# Patient Record
Sex: Female | Born: 1960 | Race: White | Hispanic: No | Smoking: Never smoker
Health system: Southern US, Community
[De-identification: ages and names within clinical notes are randomized; demographics above are authoritative.]

## PROBLEM LIST (undated history)

## (undated) DIAGNOSIS — I10 Essential (primary) hypertension: Secondary | ICD-10-CM

## (undated) DIAGNOSIS — E119 Type 2 diabetes mellitus without complications: Secondary | ICD-10-CM

## (undated) HISTORY — PX: APPENDECTOMY: SHX54

---

## 2008-11-15 ENCOUNTER — Ambulatory Visit: Payer: Self-pay | Admitting: Diagnostic Radiology

## 2008-11-15 ENCOUNTER — Emergency Department (HOSPITAL_BASED_OUTPATIENT_CLINIC_OR_DEPARTMENT_OTHER): Admission: EM | Admit: 2008-11-15 | Discharge: 2008-11-15 | Payer: Self-pay | Admitting: Emergency Medicine

## 2010-05-18 LAB — URINALYSIS, ROUTINE W REFLEX MICROSCOPIC
Bilirubin Urine: NEGATIVE
Hgb urine dipstick: NEGATIVE
Protein, ur: NEGATIVE mg/dL
pH: 5.5 (ref 5.0–8.0)

## 2010-05-18 LAB — URINE MICROSCOPIC-ADD ON

## 2010-05-18 LAB — PREGNANCY, URINE: Preg Test, Ur: NEGATIVE

## 2016-06-18 ENCOUNTER — Emergency Department (HOSPITAL_BASED_OUTPATIENT_CLINIC_OR_DEPARTMENT_OTHER): Payer: Managed Care, Other (non HMO)

## 2016-06-18 ENCOUNTER — Encounter (HOSPITAL_BASED_OUTPATIENT_CLINIC_OR_DEPARTMENT_OTHER): Payer: Self-pay | Admitting: *Deleted

## 2016-06-18 ENCOUNTER — Emergency Department (HOSPITAL_BASED_OUTPATIENT_CLINIC_OR_DEPARTMENT_OTHER)
Admission: EM | Admit: 2016-06-18 | Discharge: 2016-06-18 | Discharge status: Against Medical Advice | Payer: Managed Care, Other (non HMO) | Attending: Emergency Medicine | Admitting: Emergency Medicine

## 2016-06-18 DIAGNOSIS — I10 Essential (primary) hypertension: Secondary | ICD-10-CM | POA: Diagnosis not present

## 2016-06-18 DIAGNOSIS — Z5181 Encounter for therapeutic drug level monitoring: Secondary | ICD-10-CM | POA: Diagnosis not present

## 2016-06-18 DIAGNOSIS — R4781 Slurred speech: Secondary | ICD-10-CM | POA: Diagnosis present

## 2016-06-18 DIAGNOSIS — E119 Type 2 diabetes mellitus without complications: Secondary | ICD-10-CM | POA: Insufficient documentation

## 2016-06-18 DIAGNOSIS — R410 Disorientation, unspecified: Secondary | ICD-10-CM | POA: Diagnosis not present

## 2016-06-18 HISTORY — DX: Essential (primary) hypertension: I10

## 2016-06-18 HISTORY — DX: Type 2 diabetes mellitus without complications: E11.9

## 2016-06-18 LAB — CBC
HEMATOCRIT: 28.5 % — AB (ref 36.0–46.0)
HEMOGLOBIN: 8.5 g/dL — AB (ref 12.0–15.0)
MCH: 19.5 pg — ABNORMAL LOW (ref 26.0–34.0)
MCHC: 29.8 g/dL — ABNORMAL LOW (ref 30.0–36.0)
MCV: 65.4 fL — AB (ref 78.0–100.0)
Platelets: 451 10*3/uL — ABNORMAL HIGH (ref 150–400)
RBC: 4.36 MIL/uL (ref 3.87–5.11)
RDW: 18.9 % — ABNORMAL HIGH (ref 11.5–15.5)
WBC: 7.8 10*3/uL (ref 4.0–10.5)

## 2016-06-18 LAB — COMPREHENSIVE METABOLIC PANEL
ALBUMIN: 3.5 g/dL (ref 3.5–5.0)
ALK PHOS: 106 U/L (ref 38–126)
ALT: 29 U/L (ref 14–54)
ANION GAP: 10 (ref 5–15)
AST: 32 U/L (ref 15–41)
BILIRUBIN TOTAL: 0.2 mg/dL — AB (ref 0.3–1.2)
BUN: 19 mg/dL (ref 6–20)
CALCIUM: 9.1 mg/dL (ref 8.9–10.3)
CO2: 26 mmol/L (ref 22–32)
Chloride: 96 mmol/L — ABNORMAL LOW (ref 101–111)
Creatinine, Ser: 0.91 mg/dL (ref 0.44–1.00)
GFR calc Af Amer: >60 mL/min (ref >60)
GFR calc non Af Amer: >60 mL/min (ref >60)
GLUCOSE: 362 mg/dL — AB (ref 65–99)
POTASSIUM: 3.8 mmol/L (ref 3.5–5.1)
SODIUM: 132 mmol/L — AB (ref 135–145)
TOTAL PROTEIN: 7.4 g/dL (ref 6.5–8.1)

## 2016-06-18 LAB — PROTIME-INR
INR: 0.92
Prothrombin Time: 12.4 seconds (ref 11.4–15.2)

## 2016-06-18 LAB — DIFFERENTIAL
BASOS ABS: 0.1 10*3/uL (ref 0.0–0.1)
Basophils Relative: 1 %
EOS PCT: 10 %
Eosinophils Absolute: 0.8 10*3/uL — ABNORMAL HIGH (ref 0.0–0.7)
LYMPHS ABS: 2 10*3/uL (ref 0.7–4.0)
Lymphocytes Relative: 25 %
MONOS PCT: 6 %
Monocytes Absolute: 0.5 10*3/uL (ref 0.1–1.0)
Neutro Abs: 4.4 10*3/uL (ref 1.7–7.7)
Neutrophils Relative %: 58 %

## 2016-06-18 LAB — APTT: APTT: 28 s (ref 24–36)

## 2016-06-18 LAB — ETHANOL: Alcohol, Ethyl (B): <5 mg/dL (ref <5)

## 2016-06-18 LAB — TROPONIN I: Troponin I: <0.03 ng/mL (ref <0.03)

## 2016-06-18 NOTE — ED Notes (Addendum)
Pt states on arrival to tx room she is refusing to have any blood drawn unless it goes to [facility]. States she cannot afford it. Speech is not slurred at this time.

## 2016-06-18 NOTE — Discharge Instructions (Signed)
You were seen in the ED today with slurred speech. We have concerns for a possible stroke as the cause of your symptoms. We encourage you to see your PCP and Neurologist listed below as soon as possible.   You should return to the ED with any new or worsening symptoms.

## 2016-06-18 NOTE — ED Notes (Signed)
ED Provider at bedside. 

## 2016-06-18 NOTE — ED Triage Notes (Signed)
States at 2:30 she was at work and experienced chest pain, memory loss and slurred speech. She was given an ASA and by the time she got here her symptoms had subsided.

## 2016-06-18 NOTE — ED Notes (Signed)
Patient transported to CT 

## 2016-06-18 NOTE — ED Notes (Signed)
Pt sts she came to the ED for a CT scan only.  Pt is refusing any blood work and refusing admission.  Sts she cannot afford a hosp bill.  Dr. Jacqulyn BathLong, EDP, has explained to pt multiple times the importance of the blood work and radiology studies to ensure that she did not have a stroke and ensure her safety.  He explained to her that although her sx have resolved she is at a great risk of having a stroke in the near future.  Pt continues to refuse this tx citing expense as the reason.  She has asked for time to call her insurance company before she leaves AMA.  Dr. Jacqulyn BathLong has agreed to this delay, although he explained to pt that any delay is dangerous and can worsen her ultimate outcome.  Pt voiced understanding.

## 2016-06-18 NOTE — ED Provider Notes (Signed)
Emergency Department Provider Note  By signing my name below, I, Modena Jansky, attest that this documentation has been prepared under the direction and in the presence of Cylan Borum, Arlyss Repress, MD. Electronically Signed: Modena Jansky, Scribe. 06/18/2016. 4:24 PM.  I have reviewed the triage vital signs and the nursing notes.   HISTORY  Chief Complaint Aphasia  HPI Comments: Katelyn Rogers is a 56 y.o. female, with a PMHx of DM, HTN, and hyperlipidemia, who presents to the Emergency Department complaining of speech difficulty that started about 2 hours ago. She states she started having slurred speech while talking. She went to her PCP and was sent to the ED. She reports associated confusion. Her speech has improved but still has some confusion. Denies any prior hx of stroke, lightheadness, new numbness/tingling, tongue swelling, or other complaints at this time.  Past Medical History:  Diagnosis Date  . Diabetes mellitus without complication (HCC)   . Hypertension     There are no active problems to display for this patient.   Past Surgical History:  Procedure Laterality Date  . APPENDECTOMY    . CESAREAN SECTION        Allergies Penicillins; Sudafed [pseudoephedrine hcl]; and Nsaids  No family history on file.  Social History Social History  Substance Use Topics  . Smoking status: Never Smoker  . Smokeless tobacco: Never Used  . Alcohol use No    Review of Systems  Constitutional: No fever/chills Eyes: No visual changes. ENT: No sore throat. Cardiovascular: Denies chest pain. Respiratory: Denies shortness of breath. Gastrointestinal: No abdominal pain.  No nausea, no vomiting.  No diarrhea.  No constipation. Genitourinary: Negative for dysuria. Musculoskeletal: Negative for back pain. Skin: Negative for rash. Neurological: +speech difficulty +confusion. Negative for headaches, focal weakness or numbness.  10-point ROS otherwise  negative.  ____________________________________________   PHYSICAL EXAM:  VITAL SIGNS: Vitals:   06/18/16 1730 06/18/16 1824  BP: 134/69 129/76  Pulse: 94 94  Resp: 14 20  Temp:      Constitutional: Alert and oriented. Well appearing and in no acute distress. Eyes: Conjunctivae are normal.  Head: Atraumatic. Nose: No congestion/rhinnorhea. Mouth/Throat: Mucous membranes are moist.  Neck: No stridor.   Cardiovascular: Normal rate, regular rhythm. Good peripheral circulation. Grossly normal heart sounds.   Respiratory: Normal respiratory effort.  No retractions. Lungs CTAB. Gastrointestinal: Soft and nontender. No distention.  Musculoskeletal: No lower extremity tenderness nor edema. No gross deformities of extremities. Neurologic:  Normal speech and language. No gross focal neurologic deficits are appreciated. Normal CN exam 2-12. No pronator drift. Skin:  Skin is warm, dry and intact. No rash noted. Psychiatric: Mood and affect are normal. Speech and behavior are normal.  ____________________________________________   LABS (all labs ordered are listed, but only abnormal results are displayed)  Labs Reviewed  CBC - Abnormal; Notable for the following:       Result Value   Hemoglobin 8.5 (*)    HCT 28.5 (*)    MCV 65.4 (*)    MCH 19.5 (*)    MCHC 29.8 (*)    RDW 18.9 (*)    Platelets 451 (*)    All other components within normal limits  DIFFERENTIAL - Abnormal; Notable for the following:    Eosinophils Absolute 0.8 (*)    All other components within normal limits  COMPREHENSIVE METABOLIC PANEL - Abnormal; Notable for the following:    Sodium 132 (*)    Chloride 96 (*)    Glucose, Bld 362 (*)  Total Bilirubin 0.2 (*)    All other components within normal limits  ETHANOL  PROTIME-INR  APTT  TROPONIN I   ____________________________________________  EKG   EKG Interpretation  Date/Time:  Monday Jun 18 2016 16:13:14 EDT Ventricular Rate:  102 PR  Interval:    QRS Duration: 83 QT Interval:  353 QTC Calculation: 460 R Axis:   7 Text Interpretation:  Sinus tachycardia Low voltage, precordial leads Abnormal R-wave progression, early transition Baseline wander in lead(s) V4 No STEMI.  Confirmed by Ellajane Stong MD, Wilfred Dayrit [phone]) on 06/18/2016 4:39:13 PM       ____________________________________________  RADIOLOGY  Ct Head Wo Contrast  Result Date: 06/18/2016 CLINICAL DATA:  Chest pain and slurred speech beginning at 2:30 p.m. today. EXAM: CT HEAD WITHOUT CONTRAST TECHNIQUE: Contiguous axial images were obtained from the base of the skull through the vertex without intravenous contrast. COMPARISON:  None. FINDINGS: Brain: Appears normal without hemorrhage, infarct, mass lesion, mass effect, midline shift or abnormal extra-axial fluid collection. No hydrocephalus or pneumocephalus. Vascular: Negative. Skull: Intact. Sinuses/Orbits: Mild mucosal thickening is seen in the maxillary sinuses bilaterally Other: None. IMPRESSION: No acute abnormality. Mild appearing bilateral maxillary sinus disease. Electronically Signed   By: Drusilla Kannerhomas  Dalessio M.D.   On: 06/18/2016 17:13    ____________________________________________   PROCEDURES  Procedure(s) performed:   Procedures  None ____________________________________________   INITIAL IMPRESSION / ASSESSMENT AND PLAN / ED COURSE  Pertinent labs & imaging results that were available during my care of the patient were reviewed by me and considered in my medical decision making (see chart for details).  Patient resents to the emergency department for evaluation of confusion and aphasia that started approximately 2:30 PM. She reports that symptoms have resolved. She came from her PCP who gave a baby aspirin prior to departure. She arrived by private vehicle. On my assessment her symptoms have resolved she is neuro intact. Concern for TIA versus other underlying medical issue causing these symptoms.  Patient expresses concern regarding her hospital bill for testing and is refusing any imaging or blood testing until she can call to discuss this with her insurance company. Given that the patient's symptoms have resolved she is not a code stroke or tPA candidate. I discussed that delaying diagnosis could lead to increasing morbidity or mortality and patient verbalized understanding of this.  04:50 PM Patient spoke with her insurance company and has agreed to lab testing and head CT. At this point she is unwilling to be admitted or have MRI. We'll discuss further after his initial testing.  06:11 PM CT scan of the head is negative. Lab work shows some anemia but not the level of requiring transfusion. Lower suspicion of this is causing her neurological symptoms. I advised that she be admitted for MRI and TIA w/u and the patient refused. She states that MRIs are too expensive and she cannot afford it. I discussed that by not undergoing his workup at this time and she may be putting herself at increased risk for larger strokes in the future which could be prevented with medication optimization. Patient verbalizes understanding. She is a friend at bedside is present during the conversation. She will call her primary care physician tomorrow and try and schedule an outpatient appointment. She'll he has an appointment with him on Thursday. I plan to have the patient sign out AGAINST MEDICAL ADVICE. In addition to discussing this with her primary care physician the patient was advised to contact neurology to continue this evaluation as  an outpatient. I encouraged her to return to the emergency department any time if she changes her mind or has new or worsening symptoms. ____________________________________________  FINAL CLINICAL IMPRESSION(S) / ED DIAGNOSES  Final diagnoses:  Slurred speech     MEDICATIONS GIVEN DURING THIS VISIT:  None. Took ASA PTA.   NEW OUTPATIENT MEDICATIONS STARTED DURING THIS  VISIT:  There are no discharge medications for this patient.     Note:  This document was prepared using Dragon voice recognition software and may include unintentional dictation errors.  I personally performed the services described in this documentation, which was scribed in my presence. The recorded information has been reviewed and is accurate.    Alona Bene, MD Emergency Medicine      Derra Shartzer, Arlyss Repress, MD 06/18/16 [phone]

## 2018-08-16 IMAGING — CT CT HEAD W/O CM
3 series · 15 of 47 positions shown, 18 images · non-contrast
Comparison: None.

CLINICAL DATA: Chest pain and slurred speech beginning at [DATE] p.m.
today.

EXAM:
CT HEAD WITHOUT CONTRAST
TECHNIQUE: Contiguous axial images were obtained from the base of the skull
through the vertex without intravenous contrast.

[Series 2: head wo · axial · 0.42mm/px · z∈[-160,-30]mm · 9 of 32 slices shown, 12 images]
[im 3/32  brain]
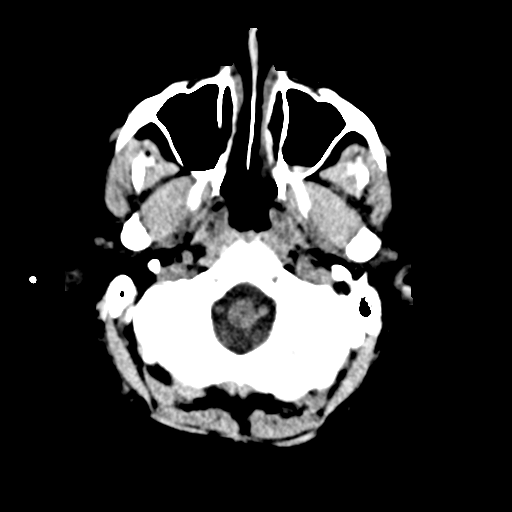
[im 3/32  bone]
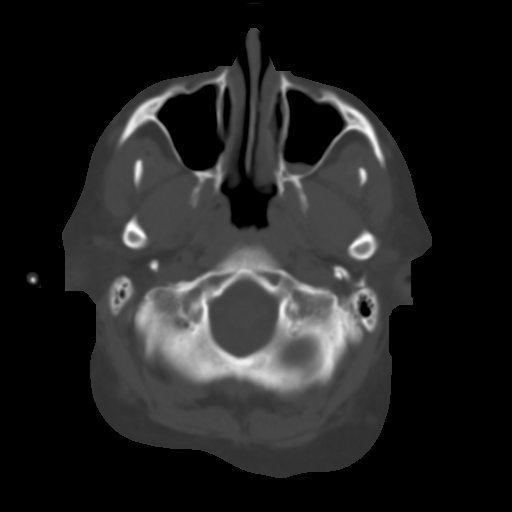
[im 6/32  brain]
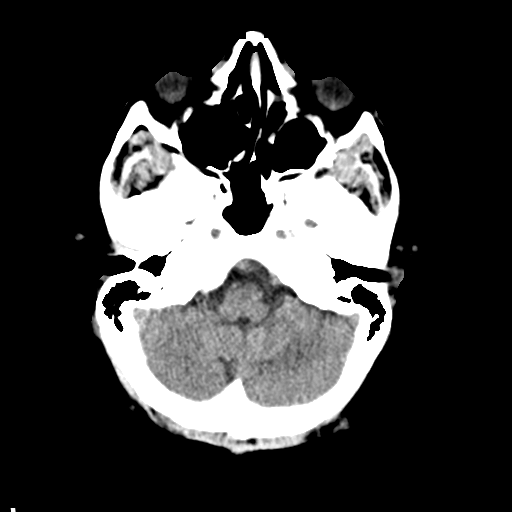
[im 9/32  brain]
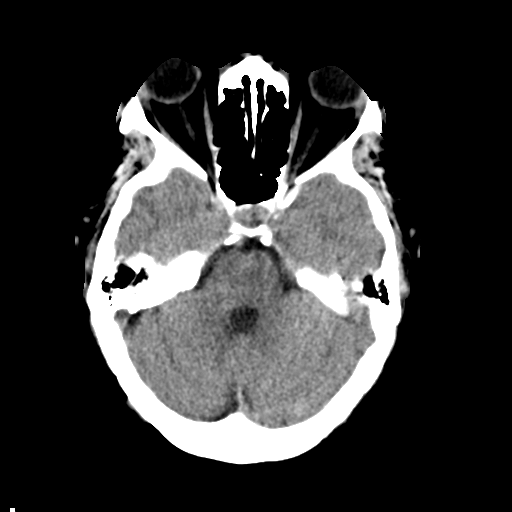
[im 12/32  brain]
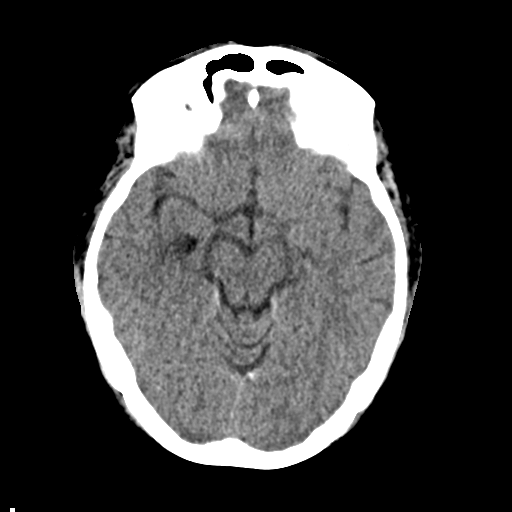
[im 17/32  brain]
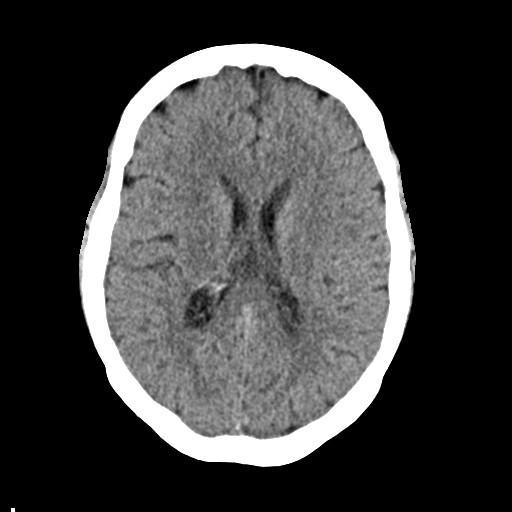
[im 17/32  bone]
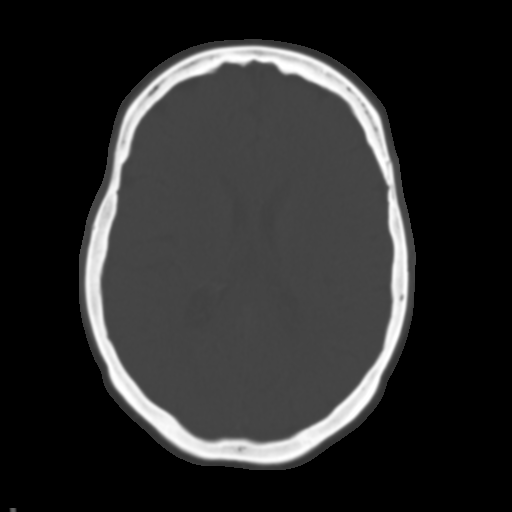
[im 20/32  brain]
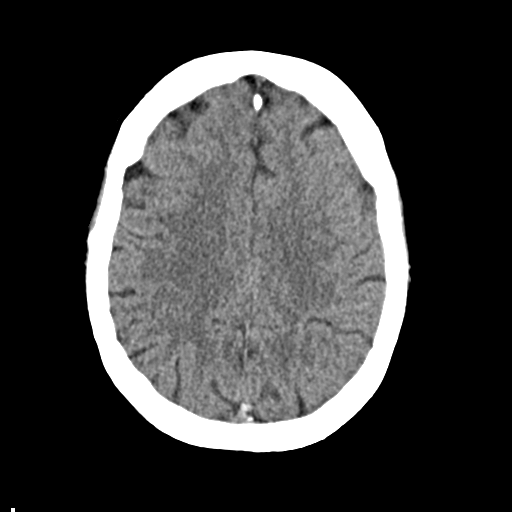
[im 23/32  brain]
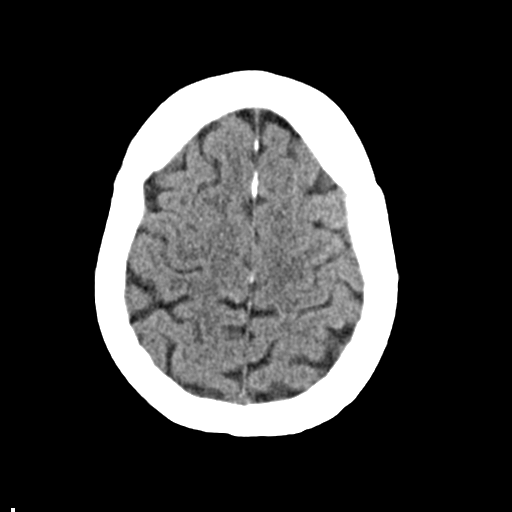
[im 26/32  brain]
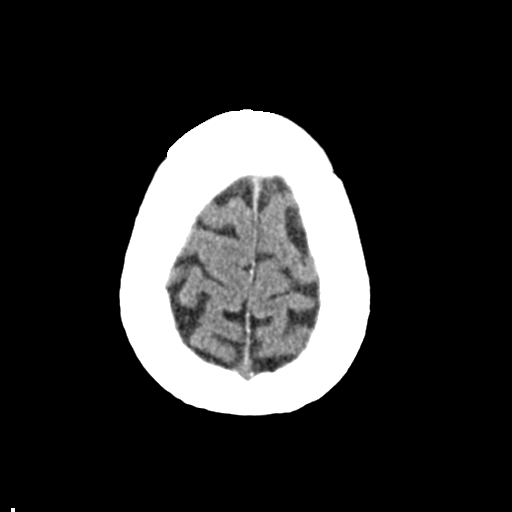
[im 29/32  brain]
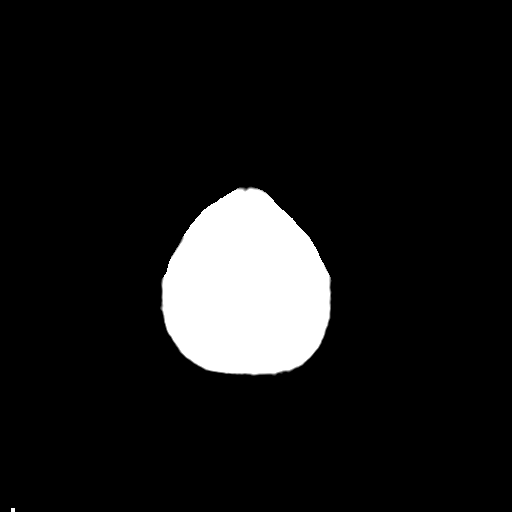
[im 29/32  bone]
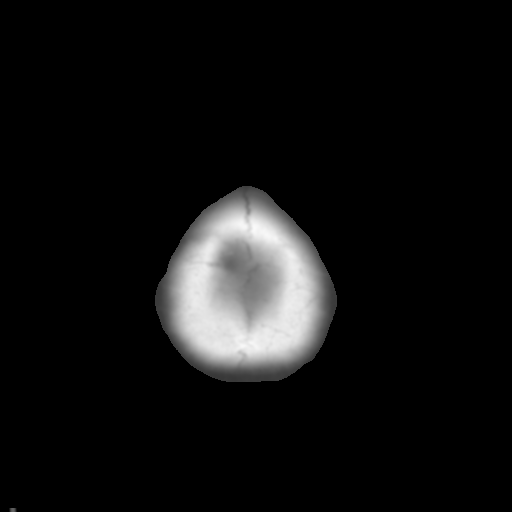

[Series 4: coronal soft · coronal · 0.30mm/px · 3 of 65 slices shown]
[im 22/65  brain]
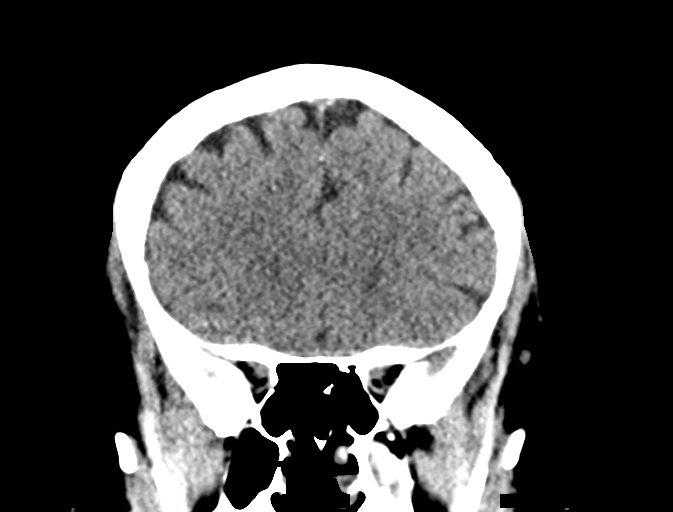
[im 29/65  brain]
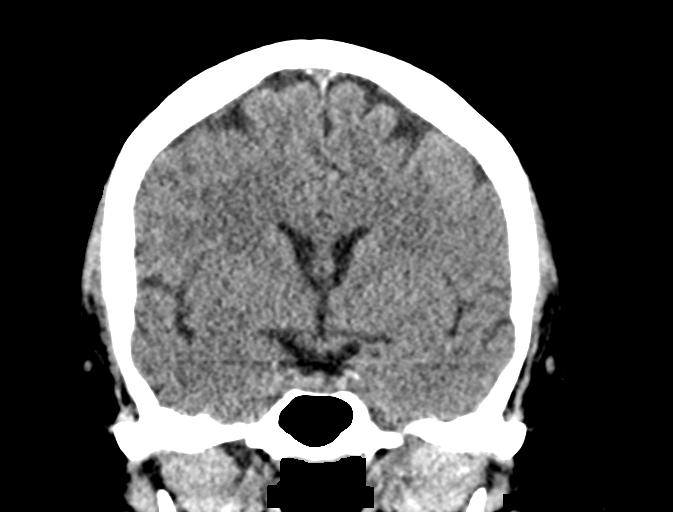
[im 36/65  brain]
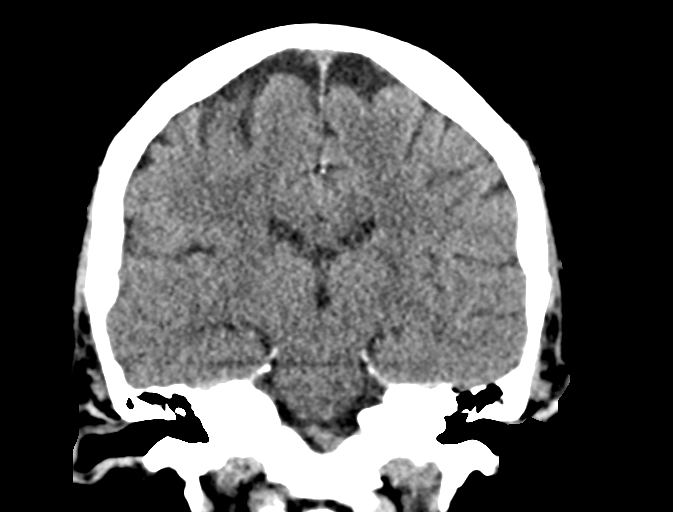

[Series 5: sag soft · sagittal · 0.30mm/px · 3 of 55 slices shown]
[im 19/55  brain]
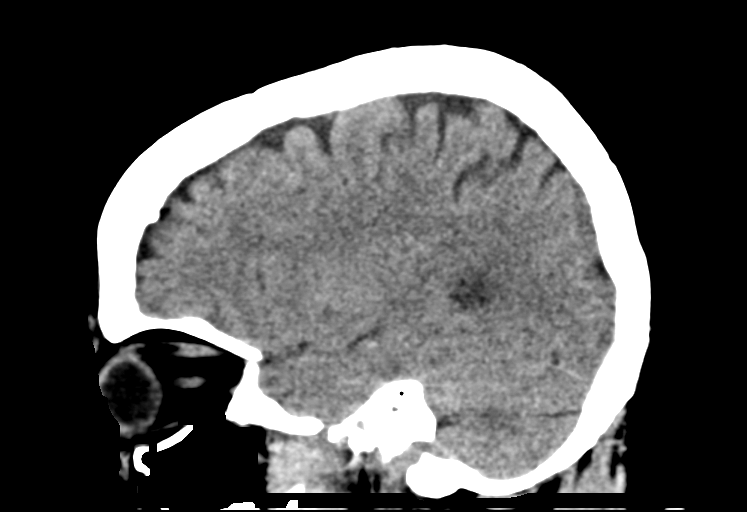
[im 28/55  brain]
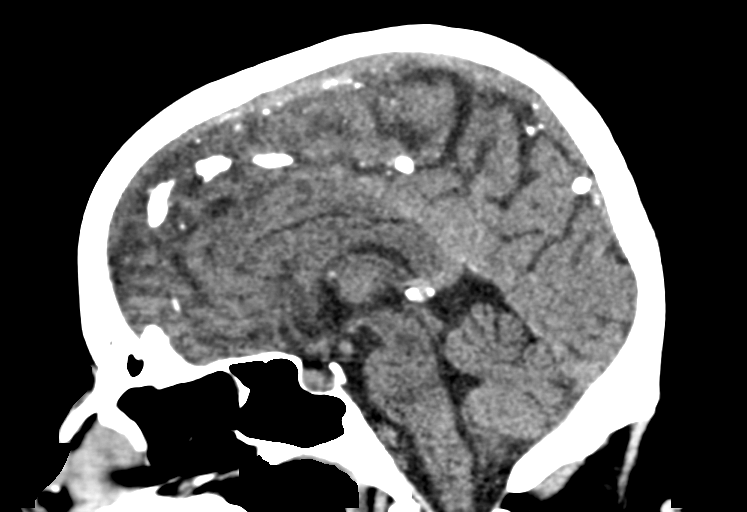
[im 37/55  brain]
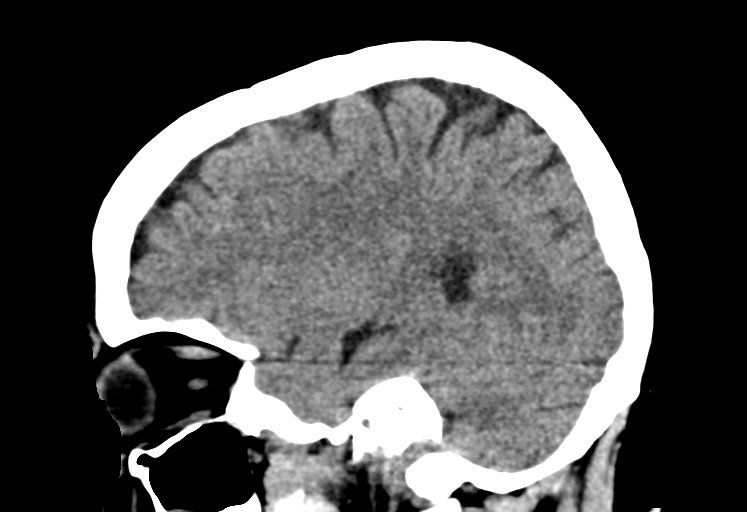

[15 of 47 positions shown; findings below may reference images not displayed]

FINDINGS: Brain: Appears normal without hemorrhage, infarct, mass lesion, mass
effect, midline shift or abnormal extra-axial fluid collection. No
hydrocephalus or pneumocephalus.

Vascular: Negative.

Skull: Intact.

Sinuses/Orbits: Mild mucosal thickening is seen in the maxillary
sinuses bilaterally

Other: None.
IMPRESSION: No acute abnormality.

Mild appearing bilateral maxillary sinus disease.
